# Patient Record
Sex: Female | Born: 1980 | Race: Black or African American | Hispanic: No | Marital: Single | Smoking: Never smoker
Health system: Southern US, Community
[De-identification: ages and names within clinical notes are randomized; demographics above are authoritative.]

## PROBLEM LIST (undated history)

## (undated) DIAGNOSIS — R51 Headache: Secondary | ICD-10-CM

## (undated) DIAGNOSIS — R519 Headache, unspecified: Secondary | ICD-10-CM

## (undated) DIAGNOSIS — F419 Anxiety disorder, unspecified: Secondary | ICD-10-CM

## (undated) HISTORY — PX: WISDOM TOOTH EXTRACTION: SHX21

---

## 2002-06-13 ENCOUNTER — Emergency Department (HOSPITAL_COMMUNITY): Admission: EM | Admit: 2002-06-13 | Discharge: 2002-06-13 | Payer: Self-pay | Admitting: Emergency Medicine

## 2002-08-30 ENCOUNTER — Emergency Department (HOSPITAL_COMMUNITY): Admission: EM | Admit: 2002-08-30 | Discharge: 2002-08-30 | Payer: Self-pay | Admitting: Emergency Medicine

## 2003-04-19 ENCOUNTER — Emergency Department (HOSPITAL_COMMUNITY): Admission: EM | Admit: 2003-04-19 | Discharge: 2003-04-19 | Payer: Self-pay | Admitting: Emergency Medicine

## 2004-03-18 ENCOUNTER — Emergency Department (HOSPITAL_COMMUNITY): Admission: EM | Admit: 2004-03-18 | Discharge: 2004-03-18 | Payer: Self-pay | Admitting: Emergency Medicine

## 2004-05-28 ENCOUNTER — Emergency Department (HOSPITAL_COMMUNITY): Admission: EM | Admit: 2004-05-28 | Discharge: 2004-05-28 | Payer: Self-pay | Admitting: Emergency Medicine
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
## 2010-04-01 ENCOUNTER — Emergency Department (HOSPITAL_COMMUNITY): Admission: EM | Admit: 2010-04-01 | Discharge: 2010-04-01 | Payer: Self-pay | Admitting: Emergency Medicine

## 2010-10-09 LAB — URINE MICROSCOPIC-ADD ON

## 2010-10-09 LAB — URINALYSIS, ROUTINE W REFLEX MICROSCOPIC: Urobilinogen, UA: 1 mg/dL (ref 0.0–1.0)

## 2010-10-09 LAB — WET PREP, GENITAL
Trich, Wet Prep: NONE SEEN
Yeast Wet Prep HPF POC: NONE SEEN

## 2010-10-09 LAB — POCT PREGNANCY, URINE: Preg Test, Ur: NEGATIVE

## 2014-08-09 ENCOUNTER — Ambulatory Visit (INDEPENDENT_AMBULATORY_CARE_PROVIDER_SITE_OTHER): Payer: 59 | Admitting: Podiatry

## 2014-08-09 ENCOUNTER — Ambulatory Visit (INDEPENDENT_AMBULATORY_CARE_PROVIDER_SITE_OTHER): Payer: 59

## 2014-08-09 ENCOUNTER — Encounter: Payer: Self-pay | Admitting: Podiatry

## 2014-08-09 VITALS — BP 117/67 | HR 78 | Resp 16

## 2014-08-09 DIAGNOSIS — R609 Edema, unspecified: Secondary | ICD-10-CM

## 2014-08-09 DIAGNOSIS — M779 Enthesopathy, unspecified: Secondary | ICD-10-CM

## 2014-08-09 MED ORDER — TRIAMCINOLONE ACETONIDE 10 MG/ML IJ SUSP
10.0000 mg | Freq: Once | INTRAMUSCULAR | Status: AC
  Administered 2014-08-09: 10 mg

## 2014-08-09 NOTE — Progress Notes (Signed)
Subjective:     Patient ID: Natasha [name], female   DOB: October 26, 1980, 34 y.o.   MRN: [id number]  HPI patient presents after not being seen for a number of years with pain in the medial side of the left foot. She is extremely obese which is complicating factor with flatfoot and had this problem on the right foot about 3 years ago   Review of Systems  All other systems reviewed and are negative.      Objective:   Physical Exam  Constitutional: She is oriented to person, place, and time.  Cardiovascular: Intact distal pulses.   Musculoskeletal: Normal range of motion.  Neurological: She is oriented to person, place, and time.  Skin: Skin is warm.  Nursing note and vitals reviewed.  neurovascular status intact muscle strength adequate with range of motion subtalar midtarsal joint within normal limits. Patient's noted to have good digital perfusion and she's well oriented 3 with severe flatfoot deformity bilateral with obesity as a complicating factor. There is quite a bit of discomfort around the posterior tibial insertion into the navicular with inflammation and fluid buildup noted but muscle strength appears normal     Assessment:     Posterior tibial tendinitis secondary to foot structure and obesity    Plan:     H&P and x-rays reviewed. We reviewed the acute and chronic nature of her conditions the fact she's now had an on both feet and today I did a careful sheath injection of the posterior tibial tendon at the insertion 3 mg Kenalog 5 mg Xylocaine and applied fascial brace with instructions on usage. Scanned for custom orthotics for the long-term and reappoint when those are ready in 3 weeks or earlier if issues should occur

## 2016-07-08 ENCOUNTER — Encounter: Payer: Self-pay | Admitting: Diagnostic Neuroimaging

## 2016-07-08 ENCOUNTER — Ambulatory Visit (INDEPENDENT_AMBULATORY_CARE_PROVIDER_SITE_OTHER): Payer: 59 | Admitting: Diagnostic Neuroimaging

## 2016-07-08 VITALS — BP 100/69 | HR 60 | Ht 63.75 in | Wt 387.0 lb

## 2016-07-08 DIAGNOSIS — Z6841 Body Mass Index (BMI) 40.0 and over, adult: Secondary | ICD-10-CM | POA: Diagnosis not present

## 2016-07-08 DIAGNOSIS — H4710 Unspecified papilledema: Secondary | ICD-10-CM

## 2016-07-08 NOTE — Progress Notes (Signed)
[facility]  PATIENT: Natasha [name] DOB: [birth year]  REFERRING CLINICIAN: Mart Piggs, OD HISTORY FROM: patient  REASON FOR VISIT: new consult    HISTORICAL  CHIEF COMPLAINT:  Chief Complaint  Patient presents with  . Optic nerve swelling    rm 7, New Pt    HISTORY OF PRESENT ILLNESS:    35 year old female here for evaluation of papilledema. Patient has been wearing glasses and contact lenses since age 4 years old. No recent change in vision, headaches, ringing in ears. She has intermittent headaches and when she laughs she sometimes has pressure in her head. Sometimes he has had episodes of dizziness while looking up. No transient visual obscuration. Patient has had significant weight gain over the past 10 years from 315 up to 384 pounds. 40 pounds weight gain was in the last 1 year.  Patient went for routine eye examination was found to have mild papilledema. Therefore patient referred to me for further evaluation consideration of possible pseudotumor cerebri.   REVIEW OF SYSTEMS: Full 14 system review of systems performed and negative with exception of: Weight gain feeling hot.  ALLERGIES: No Known Allergies  HOME MEDICATIONS: Outpatient Medications Prior to Visit  Medication Sig Dispense Refill  . PRESCRIPTION MEDICATION BCP     No facility-administered medications prior to visit.     PAST MEDICAL HISTORY: History reviewed. No pertinent past medical history.  PAST SURGICAL HISTORY: Past Surgical History:  Procedure Laterality Date  . WISDOM TOOTH EXTRACTION      FAMILY HISTORY: History reviewed. No pertinent family history.  SOCIAL HISTORY:  Social History   Social History  . Marital status: Single    Spouse name: N/A  . Number of children: 0  . Years of education: 60   Occupational History  . [employer]    Social History Main Topics  . Smoking status: Never Smoker  . Smokeless tobacco: Never Used  . Alcohol use 0.0 oz/week   Comment: 1 every 2 weeks  . Drug use: No  . Sexual activity: Not on file   Other Topics Concern  . Not on file   Social History Narrative   Lives with sig other   No caffeine     PHYSICAL EXAM  GENERAL EXAM/CONSTITUTIONAL: Vitals:  Vitals:   07/08/16 0846  BP: 100/69  Pulse: 60  Weight: (!) 387 lb (175.5 kg)  Height: 5' 3.75" (1.619 m)     Body mass index is 66.95 kg/m.  Visual Acuity Screening   Right eye Left eye Both eyes  Without correction:     With correction: 20/50 20/50   Comments: 07/08/16 contacts    Patient is in no distress; well developed, nourished and groomed; neck is supple  CARDIOVASCULAR:  Examination of carotid arteries is normal; no carotid bruits  Regular rate and rhythm, no murmurs  Examination of peripheral vascular system by observation and palpation is normal  EYES:  Ophthalmoscopic exam of optic discs and posterior segments is normal; no papilledema or hemorrhages  MUSCULOSKELETAL:  Gait, strength, tone, movements noted in Neurologic exam below  NEUROLOGIC: MENTAL STATUS:  No flowsheet data found.  awake, alert, oriented to person, place and time  recent and remote memory intact  normal attention and concentration  language fluent, comprehension intact, naming intact,   fund of knowledge appropriate  CRANIAL NERVE:   2nd - MILD BLURRED DISC MARGINS INFERIORLY  2nd, 3rd, 4th, 6th - pupils equal and reactive to light, visual fields full to confrontation, extraocular  muscles intact, no nystagmus  5th - facial sensation symmetric  7th - facial strength symmetric  8th - hearing intact  9th - palate elevates symmetrically, uvula midline  11th - shoulder shrug symmetric  12th - tongue protrusion midline  MOTOR:   normal bulk and tone, full strength in the BUE, BLE  SENSORY:   normal and symmetric to light touch, pinprick, temperature, vibration  COORDINATION:   finger-nose-finger, fine finger movements  normal  REFLEXES:   deep tendon reflexes present and symmetric  GAIT/STATION:   narrow based gait; able to walk on toes, heels and tandem; romberg is negative    DIAGNOSTIC DATA (LABS, IMAGING, TESTING) - I reviewed patient records, labs, notes, testing and imaging myself where available.  No results found for: WBC, HGB, HCT, MCV, PLT No results found for: NA, K, CL, CO2, GLUCOSE, BUN, CREATININE, CALCIUM, PROT, ALBUMIN, AST, ALT, ALKPHOS, BILITOT, GFRNONAA, GFRAA No results found for: CHOL, HDL, LDLCALC, LDLDIRECT, TRIG, CHOLHDL No results found for: HGBA1C No results found for: VITAMINB12 No results found for: TSH      ASSESSMENT AND PLAN  35 y.o. year old female here with 70 pound weight gain over the past 10 years, with 40 pounds weight gain the past one year, now with new onset of papilledema. No significant headaches, tinnitus, blurred vision problems. Patient has significant obesity with BMI of 67. Patient is at high risk for idiopathic intracranial hypertension.   Ddx: IIH (idiopathic intracranial hypertension) vs secondary causes (mass, stroke, inflammation)  1. Papilledema   2. BMI 60.0-69.9, adult (HCC)      PLAN: - check MRI brain w/wo (rule out mass, stroke or other cause of papilledema) - check sleep study (rule out OSA due to severe obesity and papilledema) - then consider lumbar puncture to measure pressure - consider empiric topiramate in future - reviewed nutrition, exercise, occupational hazards (sitting for prolonged time) and weight loss strategies; may need to consider referral to medical weight mgmt clinic or bariatric surgery options for BMI 67  Orders Placed This Encounter  Procedures  . MR BRAIN W WO CONTRAST  . Ambulatory referral to Sleep Studies   Return in about 2 months (around 09/08/2016).  I reviewed labs, notes, records myself. I summarized findings and reviewed with patient, for this high risk condition (papilledema; severe obesity  BMI 67) requiring high complexity decision making.     Penni Bombard, MD [id number], [id number] AM Certified in Neurology, Neurophysiology and Neuroimaging  [facility] [address] [place], [place] [address] [phone]

## 2016-07-08 NOTE — Patient Instructions (Signed)
Thank you for coming to see [place] at [facility]. I hope we have been able to provide you high quality care today.  You may receive a patient satisfaction survey over the next few weeks. We would appreciate your feedback and comments so that we may continue to improve ourselves and the health of our patients.  - I will check MRI brain and sleep study  - You may read about IIH (idiopathic intracranial hypertension) online, which is a possible diagnosis    ~~~~~~~~~~~~~~~~~~~~~~~~~~~~~~~~~~~~~~~~~~~~~~~~~~~~~~~~~~~~~~~~~  DR. PENUMALLI'S GUIDE TO HAPPY AND HEALTHY LIVING These are some of my general health and wellness recommendations. Some of them may apply to you better than others. Please use common sense as you try these suggestions and feel free to ask me any questions.   ACTIVITY/FITNESS Mental, social, emotional and physical stimulation are very important for brain and body health. Try learning a new activity (arts, music, language, sports, games).  Keep moving your body to the best of your abilities. You can do this at home, inside or outside, the park, community center, gym or anywhere you like. Consider a physical therapist or personal trainer to get started. Consider the app Sworkit. Fitness trackers such as smart-watches, smart-phones or Fitbits can help as well.   NUTRITION Eat more plants: colorful vegetables, nuts, seeds and berries.  Eat less sugar, salt, preservatives and processed foods.  Avoid toxins such as cigarettes and alcohol.  Drink water when you are thirsty. Warm water with a slice of lemon is an excellent morning drink to start the day.  Consider these websites for more information The Nutrition Source (https://www.henry-hernandez.biz/) Precision Nutrition (WindowBlog.ch)   RELAXATION Consider practicing mindfulness meditation or other relaxation techniques such as deep breathing, prayer, yoga,  tai chi, massage. See website mindful.org or the apps Headspace or Calm to help get started.   SLEEP Try to get at least 7-8+ hours sleep per day. Regular exercise and reduced caffeine will help you sleep better. Practice good sleep hygeine techniques. See website sleep.org for more information.   PLANNING Prepare estate planning, living will, healthcare POA documents. Sometimes this is best planned with the help of an attorney. Theconversationproject.org and agingwithdignity.org are excellent resources.

## 2016-10-05 ENCOUNTER — Other Ambulatory Visit: Payer: Self-pay | Admitting: Obstetrics and Gynecology

## 2016-10-16 NOTE — Patient Instructions (Signed)
Your procedure is scheduled on:  Tuesday, October 20, 2016  Enter through the [employer] [facility] at:  12:30 PM  Pick up the phone at the desk and dial [phone].  Call this number if you have problems the morning of surgery: [phone].  Remember: Do NOT eat food:  After 6:00 AM day of surgery  Do NOT drink clear liquids after:  10:00 AM day of surgery  Take these medicines the morning of surgery with a SIP OF WATER:  None  Stop ALL herbal medications at this time  Do NOT smoke the day of surgery.  Do NOT wear jewelry (body piercing), metal hair clips/bobby pins, make-up, or nail polish. Do NOT wear lotions, powders, or perfumes.  You may wear deodorant. Do NOT shave for 48 hours prior to surgery. Do NOT bring valuables to the hospital. Contacts, dentures, or bridgework may not be worn into surgery.  Have a responsible adult drive you home and stay with you for 24 hours after your procedure  Bring a copy of your healthcare power of attorney and living will documents.

## 2016-10-19 ENCOUNTER — Encounter (HOSPITAL_COMMUNITY): Payer: Self-pay

## 2016-10-19 ENCOUNTER — Encounter (HOSPITAL_COMMUNITY)
Admission: RE | Admit: 2016-10-19 | Discharge: 2016-10-19 | Disposition: A | Discharge status: Home / Self Care | Payer: BLUE CROSS/BLUE SHIELD | Source: Ambulatory Visit | Attending: Obstetrics and Gynecology | Admitting: Obstetrics and Gynecology

## 2016-10-19 DIAGNOSIS — I10 Essential (primary) hypertension: Secondary | ICD-10-CM | POA: Diagnosis not present

## 2016-10-19 DIAGNOSIS — N83202 Unspecified ovarian cyst, left side: Secondary | ICD-10-CM | POA: Diagnosis not present

## 2016-10-19 DIAGNOSIS — Z6841 Body Mass Index (BMI) 40.0 and over, adult: Secondary | ICD-10-CM | POA: Diagnosis not present

## 2016-10-19 DIAGNOSIS — D259 Leiomyoma of uterus, unspecified: Secondary | ICD-10-CM | POA: Diagnosis not present

## 2016-10-19 DIAGNOSIS — N838 Other noninflammatory disorders of ovary, fallopian tube and broad ligament: Secondary | ICD-10-CM | POA: Diagnosis not present

## 2016-10-19 HISTORY — DX: Headache: R51

## 2016-10-19 HISTORY — DX: Headache, unspecified: R51.9

## 2016-10-19 HISTORY — DX: Anxiety disorder, unspecified: F41.9

## 2016-10-19 HISTORY — DX: Morbid (severe) obesity due to excess calories: E66.01

## 2016-10-19 LAB — CBC
HCT: 39.7 % (ref 36.0–46.0)
Hemoglobin: 13.2 g/dL (ref 12.0–15.0)
MCH: 28.8 pg (ref 26.0–34.0)
MCHC: 33.2 g/dL (ref 30.0–36.0)
MCV: 86.5 fL (ref 78.0–100.0)
Platelets: 328 10*3/uL (ref 150–400)
RBC: 4.59 MIL/uL (ref 3.87–5.11)
RDW: 13.9 % (ref 11.5–15.5)
WBC: 9.7 10*3/uL (ref 4.0–10.5)

## 2016-10-19 NOTE — H&P (Signed)
Natasha [name] is a 36 y.o. female G:0, : who presents for removal of a persistent left ovarian cyst.  Patient  was on oral contraceptives until January 2017 when she stopped.  After that time she was amenorrheic from February until  August 2017.  In August she began to bleed daily until October 2017 with a need to change  her   pad 1-4 times a day.  She denies any cramping, pelvic pain, dyspareunia or change in bladder function.  She does report some slowing of her  bowel movements. Her TSH, Prolactin, fasting glucose, insulin and FSH were normal with a low progesterone consistent with anovulation.  A pelvic ultrasound in December 2017 revealed: anteverted uterus: 6.81 x 4.45 x 4.00 cm, endometrium: 16.7 mm; posterior left mid-subserosal fibroid 4.86 cm; right ovary: 2.26 cm and left ovary: 4.85 cm with simple appearing cyst: 4.5 cm.  She was placed on Progesterone 200 mg to thin out her endometrium.  A CA-125 and CEA were performed and both returned normal.  In March 2018 a follow up ultrasound showed the following changes:  endometrium: 0.78 cm (from 1.67 cm) and left ovarian cyst: 6.81 cm with enlarging cyst now 6.25 cm (from 4.5 cm).  Given the persistent and enlarging nature of her left ovarian cyst, along with her history of anovulation the patient has decided to proceed with removal of the left ovarian cyst.   Past Medical History  OB History: G: 0  GYN History: menarche: 36 YO  LMP: 09/17/16    Contracepton no method  The patient denies history of sexually transmitted disease.  Denies history of abnormal PAP smear.  Last PAP smear: 06/2016-normal with negative HPV  Medical History: Vitamin D Deficiency,  Eczema and  Seasonal Allergies  Surgical History: Dental Surgeries Denies history of blood transfusions  Family History: Asthma, Diabetes Mellitus, Stroke, Arthritis, Hypertension and Migraine  Social History:  Single and employed with a [employer];   Denies tobacco use but occasionally  uses alcohol   Medications: Triamcinolone Cream as directed Micronized Progesterone 200 mg qhs as directed monthly   Allergies  Allergen Reactions  . Nitrofurantoin     Did not work to treat UTI symptoms    Denies sensitivity to peanuts, shellfish, soy, latex or adhesives.   ROS: Admits to glasses and constipation but  denies headache, vision changes, nasal congestion, dysphagia, tinnitus, dizziness, hoarseness, cough,  chest pain, shortness of breath, nausea, vomiting, diarrhea,  urinary frequency, urgency  dysuria, hematuria, vaginitis symptoms, pelvic pain, swelling of joints,easy bruising,  myalgias, arthralgias,  unexplained weight loss and except as is mentioned in the history of present illness, patient's review of systems is otherwise negative.   Physical Exam  Bp: 118/76   Weight: 380 lbs.  Height: 5' 3.75"  BMI: 65.7  Neck: supple without masses or thyromegaly Lungs: clear to auscultation Heart: regular rate and rhythm Abdomen: soft, non-tender and no organomegaly Pelvic:EGBUS- wnl; vagina-normal rugae; uterus-normal size (exam limited by habitus), cervix without lesions or motion tenderness; adnexae-no tenderness or masses Extremities:  no clubbing, cyanosis or edema   Assesment: Persistent Left Ovarian Cyst  Disposition:  The patient was given the indication for her procedure(s) along with the risks, which include but are not limited to, reaction to anesthesia, damage to adjacent organs, infection, excessive bleeding, formation of scar tissue, removal of ovarian tissue  and the possible need for an open abdominal incision. She was further advised that she may experience transient post operative facial edema,  that her hospital stay is expected to be 0-1 days  and that the robotic approach to her surgery requires more time to perform than an open abdominal approach.  She verbalized understanding of these risks and has consented to proceed with Laparoscopic Left Ovarian  Cystectomy with Possible Robot Assistance and Endometrial Biopsy  at [place] on October 20, 2016.    CSN# [id number]   Yerania Chamorro J. Florene Glen, PA-C  for Dr. Dede Query. Rivard

## 2016-10-20 ENCOUNTER — Ambulatory Visit (HOSPITAL_COMMUNITY)
Admission: RE | Admit: 2016-10-20 | Discharge: 2016-10-20 | Disposition: A | Discharge status: Home / Self Care | Payer: BLUE CROSS/BLUE SHIELD | Source: Ambulatory Visit | Attending: Obstetrics and Gynecology | Admitting: Obstetrics and Gynecology

## 2016-10-20 ENCOUNTER — Encounter (HOSPITAL_COMMUNITY)
Admission: RE | Disposition: A | Discharge status: Home / Self Care | Payer: Self-pay | Source: Ambulatory Visit | Attending: Obstetrics and Gynecology

## 2016-10-20 ENCOUNTER — Ambulatory Visit (HOSPITAL_COMMUNITY): Payer: BLUE CROSS/BLUE SHIELD | Admitting: Registered Nurse

## 2016-10-20 ENCOUNTER — Encounter (HOSPITAL_COMMUNITY): Payer: Self-pay | Admitting: *Deleted

## 2016-10-20 DIAGNOSIS — N838 Other noninflammatory disorders of ovary, fallopian tube and broad ligament: Secondary | ICD-10-CM | POA: Insufficient documentation

## 2016-10-20 DIAGNOSIS — N83202 Unspecified ovarian cyst, left side: Secondary | ICD-10-CM | POA: Insufficient documentation

## 2016-10-20 DIAGNOSIS — Z6841 Body Mass Index (BMI) 40.0 and over, adult: Secondary | ICD-10-CM | POA: Insufficient documentation

## 2016-10-20 DIAGNOSIS — I10 Essential (primary) hypertension: Secondary | ICD-10-CM | POA: Insufficient documentation

## 2016-10-20 DIAGNOSIS — D259 Leiomyoma of uterus, unspecified: Secondary | ICD-10-CM | POA: Insufficient documentation

## 2016-10-20 HISTORY — PX: LAPAROSCOPY: SHX197

## 2016-10-20 LAB — PREGNANCY, URINE: Preg Test, Ur: NEGATIVE

## 2016-10-20 SURGERY — LAPAROSCOPY OPERATIVE
Anesthesia: General | Laterality: Left

## 2016-10-20 MED ORDER — ROPIVACAINE HCL 5 MG/ML IJ SOLN
INTRAMUSCULAR | Status: AC
  Filled 2016-10-20: qty 60

## 2016-10-20 MED ORDER — SCOPOLAMINE 1 MG/3DAYS TD PT72
1.0000 | MEDICATED_PATCH | Freq: Once | TRANSDERMAL | Status: DC
  Administered 2016-10-20: 1.5 mg via TRANSDERMAL

## 2016-10-20 MED ORDER — SCOPOLAMINE 1 MG/3DAYS TD PT72
MEDICATED_PATCH | TRANSDERMAL | Status: AC
  Administered 2016-10-20: 1.5 mg via TRANSDERMAL
  Filled 2016-10-20: qty 1

## 2016-10-20 MED ORDER — OXYCODONE-ACETAMINOPHEN 5-325 MG PO TABS: 1.0000 | ORAL_TABLET | ORAL | 0 refills | Status: AC | PRN

## 2016-10-20 MED ORDER — CEFOTETAN DISODIUM-DEXTROSE 2-2.08 GM-% IV SOLR
INTRAVENOUS | Status: AC
  Filled 2016-10-20: qty 50

## 2016-10-20 MED ORDER — FENTANYL CITRATE (PF) 100 MCG/2ML IJ SOLN
INTRAMUSCULAR | Status: DC | PRN
  Administered 2016-10-20 (×3): 50 ug via INTRAVENOUS
  Administered 2016-10-20: 100 ug via INTRAVENOUS

## 2016-10-20 MED ORDER — PROPOFOL 10 MG/ML IV BOLUS
INTRAVENOUS | Status: AC
  Filled 2016-10-20: qty 20

## 2016-10-20 MED ORDER — KETOROLAC TROMETHAMINE 30 MG/ML IJ SOLN
INTRAMUSCULAR | Status: AC
  Filled 2016-10-20: qty 1

## 2016-10-20 MED ORDER — STERILE WATER FOR IRRIGATION IR SOLN: Status: DC | PRN

## 2016-10-20 MED ORDER — SUCCINYLCHOLINE CHLORIDE 200 MG/10ML IV SOSY
PREFILLED_SYRINGE | INTRAVENOUS | Status: AC
  Filled 2016-10-20: qty 10

## 2016-10-20 MED ORDER — MIDAZOLAM HCL 2 MG/2ML IJ SOLN
INTRAMUSCULAR | Status: AC
  Filled 2016-10-20: qty 2

## 2016-10-20 MED ORDER — ROCURONIUM BROMIDE 100 MG/10ML IV SOLN
INTRAVENOUS | Status: DC | PRN
  Administered 2016-10-20: 50 mg via INTRAVENOUS

## 2016-10-20 MED ORDER — ARTIFICIAL TEARS OP OINT
TOPICAL_OINTMENT | OPHTHALMIC | Status: AC
  Filled 2016-10-20: qty 3.5

## 2016-10-20 MED ORDER — SUGAMMADEX SODIUM 500 MG/5ML IV SOLN
INTRAVENOUS | Status: DC | PRN
Start: 2016-10-20 — End: 2016-10-20
  Administered 2016-10-20: 350 mg via INTRAVENOUS

## 2016-10-20 MED ORDER — ROCURONIUM BROMIDE 100 MG/10ML IV SOLN
INTRAVENOUS | Status: AC
  Filled 2016-10-20: qty 1

## 2016-10-20 MED ORDER — FENTANYL CITRATE (PF) 100 MCG/2ML IJ SOLN
25.0000 ug | INTRAMUSCULAR | Status: DC | PRN
  Administered 2016-10-20: 50 ug via INTRAVENOUS

## 2016-10-20 MED ORDER — ACETAMINOPHEN 160 MG/5ML PO SOLN: 325.0000 mg | ORAL | Status: DC | PRN

## 2016-10-20 MED ORDER — VASOPRESSIN 20 UNIT/ML IV SOLN
INTRAVENOUS | Status: AC
  Filled 2016-10-20: qty 1

## 2016-10-20 MED ORDER — SUGAMMADEX SODIUM 500 MG/5ML IV SOLN
INTRAVENOUS | Status: AC
  Filled 2016-10-20: qty 5

## 2016-10-20 MED ORDER — IBUPROFEN 600 MG PO TABS: 600.0000 mg | ORAL_TABLET | Freq: Four times a day (QID) | ORAL | 0 refills | Status: AC | PRN

## 2016-10-20 MED ORDER — OXYCODONE HCL 5 MG PO TABS: 5.0000 mg | ORAL_TABLET | Freq: Once | ORAL | Status: DC | PRN

## 2016-10-20 MED ORDER — SODIUM CHLORIDE 0.9 % IJ SOLN
INTRAMUSCULAR | Status: AC
  Filled 2016-10-20: qty 50

## 2016-10-20 MED ORDER — LIDOCAINE HCL (CARDIAC) 20 MG/ML IV SOLN
INTRAVENOUS | Status: AC
  Filled 2016-10-20: qty 5

## 2016-10-20 MED ORDER — LACTATED RINGERS IR SOLN
Status: DC | PRN
  Administered 2016-10-20: 3000 mL

## 2016-10-20 MED ORDER — SODIUM CHLORIDE 0.9 % IV SOLN
INTRAVENOUS | Status: DC | PRN
  Administered 2016-10-20: 27 mL

## 2016-10-20 MED ORDER — VASOPRESSIN 20 UNIT/ML IV SOLN
INTRAVENOUS | Status: DC | PRN
  Administered 2016-10-20: 17 mL via INTRAMUSCULAR

## 2016-10-20 MED ORDER — SODIUM CHLORIDE 0.9 % IJ SOLN
INTRAMUSCULAR | Status: AC
  Filled 2016-10-20: qty 10

## 2016-10-20 MED ORDER — KETOROLAC TROMETHAMINE 30 MG/ML IJ SOLN
INTRAMUSCULAR | Status: DC | PRN
  Administered 2016-10-20: 30 mg via INTRAVENOUS

## 2016-10-20 MED ORDER — SUCCINYLCHOLINE CHLORIDE 20 MG/ML IJ SOLN
INTRAMUSCULAR | Status: DC | PRN
  Administered 2016-10-20: 140 mg via INTRAVENOUS

## 2016-10-20 MED ORDER — MIDAZOLAM HCL 2 MG/2ML IJ SOLN
INTRAMUSCULAR | Status: DC | PRN
  Administered 2016-10-20: 2 mg via INTRAVENOUS

## 2016-10-20 MED ORDER — MEPERIDINE HCL 25 MG/ML IJ SOLN: 6.2500 mg | INTRAMUSCULAR | Status: DC | PRN

## 2016-10-20 MED ORDER — FENTANYL CITRATE (PF) 250 MCG/5ML IJ SOLN
INTRAMUSCULAR | Status: AC
  Filled 2016-10-20: qty 5

## 2016-10-20 MED ORDER — ONDANSETRON HCL 4 MG/2ML IJ SOLN
INTRAMUSCULAR | Status: AC
  Filled 2016-10-20: qty 2

## 2016-10-20 MED ORDER — ACETAMINOPHEN 325 MG PO TABS: 325.0000 mg | ORAL_TABLET | ORAL | Status: DC | PRN

## 2016-10-20 MED ORDER — SODIUM CHLORIDE 0.9 % IJ SOLN
INTRAMUSCULAR | Status: AC
  Filled 2016-10-20: qty 100

## 2016-10-20 MED ORDER — LIDOCAINE HCL (CARDIAC) 20 MG/ML IV SOLN
INTRAVENOUS | Status: DC | PRN
  Administered 2016-10-20: 80 mg via INTRAVENOUS

## 2016-10-20 MED ORDER — ONDANSETRON HCL 4 MG/2ML IJ SOLN: 4.0000 mg | Freq: Once | INTRAMUSCULAR | Status: DC | PRN

## 2016-10-20 MED ORDER — OXYCODONE HCL 5 MG/5ML PO SOLN: 5.0000 mg | Freq: Once | ORAL | Status: DC | PRN

## 2016-10-20 MED ORDER — KETOROLAC TROMETHAMINE 30 MG/ML IJ SOLN: 30.0000 mg | Freq: Once | INTRAMUSCULAR | Status: DC

## 2016-10-20 MED ORDER — FENTANYL CITRATE (PF) 100 MCG/2ML IJ SOLN
INTRAMUSCULAR | Status: AC
  Administered 2016-10-20: 50 ug via INTRAVENOUS
  Filled 2016-10-20: qty 2

## 2016-10-20 MED ORDER — CEFOTETAN DISODIUM-DEXTROSE 2-2.08 GM-% IV SOLR
2.0000 g | INTRAVENOUS | Status: AC
  Administered 2016-10-20: 2 g via INTRAVENOUS

## 2016-10-20 MED ORDER — PROPOFOL 10 MG/ML IV BOLUS
INTRAVENOUS | Status: DC | PRN
  Administered 2016-10-20: 200 mg via INTRAVENOUS

## 2016-10-20 MED ORDER — ARTIFICIAL TEARS OP OINT
TOPICAL_OINTMENT | OPHTHALMIC | Status: DC | PRN
  Administered 2016-10-20: 1 via OPHTHALMIC

## 2016-10-20 MED ORDER — LACTATED RINGERS IV SOLN
INTRAVENOUS | Status: DC
  Administered 2016-10-20 (×2): via INTRAVENOUS

## 2016-10-20 MED ORDER — DEXAMETHASONE SODIUM PHOSPHATE 10 MG/ML IJ SOLN
INTRAMUSCULAR | Status: DC | PRN
  Administered 2016-10-20: 4 mg via INTRAVENOUS

## 2016-10-20 MED ORDER — DEXAMETHASONE SODIUM PHOSPHATE 4 MG/ML IJ SOLN
INTRAMUSCULAR | Status: AC
  Filled 2016-10-20: qty 1

## 2016-10-20 MED ORDER — ONDANSETRON HCL 4 MG/2ML IJ SOLN
INTRAMUSCULAR | Status: DC | PRN
  Administered 2016-10-20: 4 mg via INTRAVENOUS

## 2016-10-20 SURGICAL SUPPLY — 57 items
BARRIER ADHS 3X4 INTERCEED (GAUZE/BANDAGES/DRESSINGS) ×3 IMPLANT
CATH FOLEY 2WAY SLVR  5CC 16FR (CATHETERS) ×2
CATH FOLEY 2WAY SLVR 5CC 16FR (CATHETERS) ×1 IMPLANT
CATH FOLEY 3WAY  5CC 16FR (CATHETERS)
CATH FOLEY 3WAY 5CC 16FR (CATHETERS) IMPLANT
CLOTH BEACON ORANGE TIMEOUT ST (SAFETY) ×3 IMPLANT
CONT PATH 16OZ SNAP LID 3702 (MISCELLANEOUS) ×3 IMPLANT
COVER BACK TABLE 60X90IN (DRAPES) ×6 IMPLANT
COVER TIP SHEARS 8 DVNC (MISCELLANEOUS) ×1 IMPLANT
COVER TIP SHEARS 8MM DA VINCI (MISCELLANEOUS) ×2
DECANTER SPIKE VIAL GLASS SM (MISCELLANEOUS) ×6 IMPLANT
DEFOGGER SCOPE WARMER CLEARIFY (MISCELLANEOUS) ×3 IMPLANT
DERMABOND ADVANCED (GAUZE/BANDAGES/DRESSINGS) ×2
DERMABOND ADVANCED .7 DNX12 (GAUZE/BANDAGES/DRESSINGS) ×1 IMPLANT
DRSG OPSITE POSTOP 3X4 (GAUZE/BANDAGES/DRESSINGS) ×3 IMPLANT
DURAPREP 26ML APPLICATOR (WOUND CARE) ×3 IMPLANT
ELECT REM PT RETURN 9FT ADLT (ELECTROSURGICAL) ×3
ELECTRODE REM PT RTRN 9FT ADLT (ELECTROSURGICAL) ×1 IMPLANT
GAUZE VASELINE 3X9 (GAUZE/BANDAGES/DRESSINGS) IMPLANT
GLOVE BIOGEL PI IND STRL 7.0 (GLOVE) ×4 IMPLANT
GLOVE BIOGEL PI INDICATOR 7.0 (GLOVE) ×8
GLOVE ECLIPSE 6.5 STRL STRAW (GLOVE) ×12 IMPLANT
KIT ACCESSORY DA VINCI DISP (KITS) ×2
KIT ACCESSORY DVNC DISP (KITS) ×1 IMPLANT
NEEDLE SPNL 22GX7 QUINCKE BK (NEEDLE) ×3 IMPLANT
PACK ROBOT WH (CUSTOM PROCEDURE TRAY) ×3 IMPLANT
PACK ROBOTIC GOWN (GOWN DISPOSABLE) ×3 IMPLANT
PACK TRENDGUARD 450 HYBRID PRO (MISCELLANEOUS) IMPLANT
PACK TRENDGUARD 600 HYBRD PROC (MISCELLANEOUS) ×1 IMPLANT
PAD PREP 24X48 CUFFED NSTRL (MISCELLANEOUS) ×6 IMPLANT
PROTECTOR NERVE ULNAR (MISCELLANEOUS) ×6 IMPLANT
SET CYSTO W/LG BORE CLAMP LF (SET/KITS/TRAYS/PACK) IMPLANT
SET IRRIG TUBING LAPAROSCOPIC (IRRIGATION / IRRIGATOR) ×3 IMPLANT
SET TRI-LUMEN FLTR TB AIRSEAL (TUBING) IMPLANT
SOLUTION ANTI FOG 6CC (MISCELLANEOUS) ×3 IMPLANT
SUT MNCRL AB 3-0 PS2 27 (SUTURE) ×6 IMPLANT
SUT VIC AB 0 CT2 27 (SUTURE) IMPLANT
SUT VIC AB 2-0 CT2 27 (SUTURE) IMPLANT
SUT VICRYL 0 UR6 27IN ABS (SUTURE) ×3 IMPLANT
SUT VLOC 180 0 9IN  GS21 (SUTURE)
SUT VLOC 180 0 9IN GS21 (SUTURE) IMPLANT
SYR 50ML LL SCALE MARK (SYRINGE) ×3 IMPLANT
SYR CONTROL 10ML LL (SYRINGE) ×6 IMPLANT
SYSTEM CONVERTIBLE TROCAR (TROCAR) ×3 IMPLANT
TIP UTERINE 5.1X6CM LAV DISP (MISCELLANEOUS) IMPLANT
TIP UTERINE 6.7X10CM GRN DISP (MISCELLANEOUS) IMPLANT
TIP UTERINE 6.7X6CM WHT DISP (MISCELLANEOUS) IMPLANT
TIP UTERINE 6.7X8CM BLUE DISP (MISCELLANEOUS) ×3 IMPLANT
TOWEL OR 17X24 6PK STRL BLUE (TOWEL DISPOSABLE) ×9 IMPLANT
TRENDGUARD 450 HYBRID PRO PACK (MISCELLANEOUS)
TRENDGUARD 600 HYBRID PROC PK (MISCELLANEOUS) ×3
TROCAR 12M 150ML BLUNT (TROCAR) ×3 IMPLANT
TROCAR 5M 150ML BLDLS (TROCAR) ×6 IMPLANT
TROCAR DISP BLADELESS 8 DVNC (TROCAR) ×1 IMPLANT
TROCAR DISP BLADELESS 8MM (TROCAR) ×2
TROCAR PORT AIRSEAL 8X120 (TROCAR) ×3 IMPLANT
WATER STERILE IRR 1000ML POUR (IV SOLUTION) ×3 IMPLANT

## 2016-10-20 NOTE — Anesthesia Procedure Notes (Signed)
Procedure Name: Intubation Date/Time: 10/20/2016 2:05 PM Performed by: Raenette Rover Pre-anesthesia Checklist: Patient identified, Emergency Drugs available, Suction available and Patient being monitored Patient Re-evaluated:Patient Re-evaluated prior to inductionOxygen Delivery Method: Circle system utilized Preoxygenation: Pre-oxygenation with 100% oxygen Intubation Type: IV induction Ventilation: Two handed mask ventilation required, Oral airway inserted - appropriate to patient size and Mask ventilation without difficulty Laryngoscope Size: Mac and 3 Grade View: Grade I Tube type: Oral Tube size: 7.0 mm Number of attempts: 1 Airway Equipment and Method: Patient positioned with wedge pillow and Stylet Placement Confirmation: ETT inserted through vocal cords under direct vision,  breath sounds checked- equal and bilateral,  positive ETCO2 and CO2 detector Secured at: 21 cm Tube secured with: Tape Dental Injury: Teeth and Oropharynx as per pre-operative assessment

## 2016-10-20 NOTE — Anesthesia Preprocedure Evaluation (Signed)
Anesthesia Evaluation  Patient identified by MRN, date of birth, ID band Patient awake    Reviewed: Allergy & Precautions, NPO status , Patient's Chart, lab work & pertinent test results  Airway Mallampati: I  TM Distance: >3 FB Neck ROM: Full    Dental no notable dental hx.    Pulmonary neg pulmonary ROS,    Pulmonary exam normal        Cardiovascular hypertension, Normal cardiovascular exam Rhythm:Regular Rate:Normal     Neuro/Psych    GI/Hepatic negative GI ROS, Neg liver ROS,   Endo/Other  Morbid obesity  Renal/GU negative Renal ROS  negative genitourinary   Musculoskeletal negative musculoskeletal ROS (+)   Abdominal (+) + obese,   Peds  Hematology negative hematology ROS (+)   Anesthesia Other Findings   Reproductive/Obstetrics negative OB ROS                             Anesthesia Physical Anesthesia Plan  ASA: III  Anesthesia Plan: General   Post-op Pain Management:    Induction: Intravenous  Airway Management Planned: Oral ETT  Additional Equipment:   Intra-op Plan:   Post-operative Plan: Extubation in OR  Informed Consent: I have reviewed the patients History and Physical, chart, labs and discussed the procedure including the risks, benefits and alternatives for the proposed anesthesia with the patient or authorized representative who has indicated his/her understanding and acceptance.   Dental advisory given  Plan Discussed with: CRNA and Surgeon  Anesthesia Plan Comments:         Anesthesia Quick Evaluation

## 2016-10-20 NOTE — Interval H&P Note (Signed)
History and Physical Interval Note:  10/20/2016 12:51 PM  Natasha [name] [name]  has presented today for surgery, with the diagnosis of Left Ovariany Cyst - 2.5 hours  The various methods of treatment have been discussed with the patient and family. After consideration of risks, benefits and other options for treatment, the patient has consented to  Procedure(s) with comments: ROBOTIC ASSISTED LAPAROSCOPIC OVARIAN CYSTECTOMY (Left) - DO NOT DRAPEand endometrial biopsy as a surgical intervention .  The patient's history has been reviewed, patient examined, no change in status, stable for surgery.  I have reviewed the patient's chart and labs.  Questions were answered to the patient's satisfaction.     Tehila Sokolow A

## 2016-10-20 NOTE — Op Note (Signed)
Preoperative diagnosis: left ovarian cyst,DUB and morbid obesity ( BMI 68)  Postoperative diagnosis: left paratubal cyst, uterine fibroid and morbid obesity  Anesthesia: General   Anesthesiologist: Dr. Sabra Heck  Procedure: laparoscopic left paratubal cystectomy and endometrial biopsy  Surgeon: Dr. Katharine Look Iretta Mangrum   Assistant: Earnstine Regal P.A.-C .  Estimated blood loss: 10cc cc   Procedure:   After being informed of the planned procedure with possible complications including but not limited to bleeding, infection, injury to other organs, need for laparotomy, expected hospital stay and recovery, informed consent is obtained and patient is taken to or #7. She is placed in lithotomy position on a Pink pad with both arms padded and tucked on each side and bilateral knee-high sequential compressive devices. She is given general anesthesia with endotracheal intubation without any complication. She is prepped and draped in a sterile fashion. A three-way Foley catheter is inserted in her bladder.   Pelvic exam reveals: anteverted uterus, mobile but exam extremely limited by body habitus  A weighted speculum is inserted in the vagina and the anterior lip of the cervix is grasped with a tenaculum forcep. The uterus was then sounded at 8 cm. We proceed with endometrial biopsy using a Novak curette.We easily dilate the cervix using Hegar dilator to #27 which allows for easy placement of the intrauterine RUMI manipulator.  Trocar placement is decided. We infiltrate  the umbilicus with 10 cc of ropivacaine per protocol and perform a 10 mm semi-elliptical incision which is brought down bluntly to the fascia. The fascia is identified and grasped with Coker forceps. The fascia is incised with Mayo scissors. Peritoneum is entered bluntly. A pursestring suture of 0 Vicryl is placed on the fascia and a 10 mm Hassan trocar is easily inserted in the abdominal cavity held in placed with a Purstring suture. This allows  for easy insufflation of a pneumoperitoneum using warmed CO2 at a maximum pressure of 15 mm of mercury.We then placed a 5 mm trocar on the left and a 5 mm trocar on the righ after infiltrating every site with ropivacaine per protocol.   Observation: Uterus has a 6 cm fundal posterior fibroid.. Both ovaries are normal.Both tubes are normal except for a 6.5 cm left paratubal cyst distending the mesosalpynx and over which the left tube is draped. Both anterior and posterior cul-de-sac are normal. Liver is visualized and normal. Appendix is not visualized.   We began by infiltrating the serosa of the left paratubal cyst  with Vasopressine 20 units/100 cc of saline.  Using monopolar scissors, we open the cyst serosa and proceed with systematic dissection of the cyst wall  from the serosa. We use blunt and sharp dissection as well as traction/contra-traction. We free the cyst  with cyst rupture.Clear fluid is drained.  The cyst wall is removed from the abdomen through the umbilical incision after changing to a 5 mm camera.   We irrigate profusely to cleanse the pelvis and confirm a satisfactory hemostasis.  All instruments are then removed.  All trochars are removed under direct visualization after evacuating the pneumoperitoneum.   The fascia of the supraumbilical incision is closed with the previously placed pursestring suture of 0 Vicryl. All incisions are then closed with subcuticular suture of 3-0 Monocryl and Dermabond.   A speculum is inserted in the vagina to confirm adequate hemostasis on the cervix.   Instrument and sponge count is complete x2. Estimated blood loss is 10 cc.   The procedure is well tolerated by the patient  who  is taken to recovery room in a well and stable condition.   Specimen: Paratubal  cyst wall and endometrial curettings sent to pathology

## 2016-10-20 NOTE — Discharge Instructions (Signed)
DISCHARGE INSTRUCTIONS: Laparoscopy  The following instructions have been prepared to help you care for yourself upon your return home today.  Wound care:  Do not get the incision wet for the first 24 hours. The incision should be kept clean and dry.  The Band-Aids or dressings may be removed the day after surgery.  Should the incision become sore, red, and swollen after the first week, check with your doctor.  Personal hygiene:  Shower the day after your procedure.  Activity and limitations:  Do NOT drive or operate any equipment today.  Do NOT lift anything more than 15 pounds for 2-3 weeks after surgery.  Do NOT rest in bed all day.  Walking is encouraged. Walk each day, starting slowly with 5-minute walks 3 or 4 times a day. Slowly increase the length of your walks.  Walk up and down stairs slowly.  Do NOT do strenuous activities, such as golfing, playing tennis, bowling, running, biking, weight lifting, gardening, mowing, or vacuuming for 2-4 weeks. Ask your doctor when it is okay to start.  Diet: Eat a light meal as desired this evening. You may resume your usual diet tomorrow.  Return to work: This is dependent on the type of work you do. For the most part you can return to a desk job within a week of surgery. If you are more active at work, please discuss this with your doctor.  What to expect after your surgery: You may have a slight burning sensation when you urinate on the first day. You may have a very small amount of blood in the urine. Expect to have a small amount of vaginal discharge/light bleeding for 1-2 weeks. It is not unusual to have abdominal soreness and bruising for up to 2 weeks. You may be tired and need more rest for about 1 week. You may experience shoulder pain for 24-72 hours. Lying flat in bed may relieve it.  Call your doctor for any of the following:  Develop a fever of 100.4 or greater  Inability to urinate 6 hours after discharge from  hospital  Severe pain not relieved by pain medications  Persistent of heavy bleeding at incision site  Redness or swelling around incision site after a week  Increasing nausea or vomiting  Patient Signature________________________________________ Nurse Signature_________________________________________POST-OPERATIVE INSTRUCTIONS TO PATIENT  Call [facility]  [phone])  for excessive pain, bleeding or temperature greater than or equal to 100.4 degrees (orally).    No driving for 24 hours No lifting (more than 20 lbs) for 2 weeks No sexual activity for 2 weeks  Pain management:  Use Ibuprofen 600 mg every 6 hours for 5 days and then as needed. Use your pain medication as needed to maintain a pain level at or below 3/10 Use Colace 1-2 capsules per day as long as you are using pain medication to avoid constipation.       Diet: normal  Bathing: may shower day after surgery  Wound Care: keep incisions clean and dry  Return to Dr. Cletis Media as scheduled  Return to work: 1 week    [name],Natasha A MD 10/20/2016 1:44 PM

## 2016-10-20 NOTE — Transfer of Care (Signed)
Immediate Anesthesia Transfer of Care Note  Patient: Natasha [name]  Procedure(s) Performed: Procedure(s): LAPAROSCOPY OPERATIVE, left paratubal cystectomy (Left)  Patient Location: PACU  Anesthesia Type:General  Level of Consciousness: awake, alert , oriented and patient cooperative  Airway & Oxygen Therapy: Patient Spontanous Breathing and Patient connected to nasal cannula oxygen  Post-op Assessment: Report given to RN and Post -op Vital signs reviewed and stable  Post vital signs: Reviewed and stable  Last Vitals:  Vitals:   10/20/16 1228  BP: (!) 142/103  Pulse: (!) 59  Resp: 16  Temp: 36.4 C    Last Pain:  Vitals:   10/20/16 1228  TempSrc: Oral  PainSc: 0-No pain      Patients Stated Pain Goal: 5 (14/70/92 9574)  Complications: No apparent anesthesia complications

## 2016-10-21 ENCOUNTER — Encounter (HOSPITAL_COMMUNITY): Payer: Self-pay | Admitting: Obstetrics and Gynecology

## 2016-10-21 NOTE — Anesthesia Postprocedure Evaluation (Signed)
Anesthesia Post Note  Patient: Natasha [name]  Procedure(s) Performed: Procedure(s) (LRB): LAPAROSCOPY OPERATIVE, left paratubal cystectomy (Left)  Patient location during evaluation: PACU Anesthesia Type: General Level of consciousness: awake and alert Pain management: pain level controlled Vital Signs Assessment: post-procedure vital signs reviewed and stable Respiratory status: spontaneous breathing, nonlabored ventilation, respiratory function stable and patient connected to nasal cannula oxygen Cardiovascular status: blood pressure returned to baseline and stable Postop Assessment: no signs of nausea or vomiting Anesthetic complications: no       Last Vitals:  Vitals:   10/20/16 1745 10/20/16 1830  BP: 130/69 125/70  Pulse: (!) 58   Resp: 18 16  Temp: 36.6 C 37.1 C    Last Pain:  Vitals:   10/20/16 1745  TempSrc:   PainSc: 3                  Lynda Rainwater

## 2017-05-12 ENCOUNTER — Encounter: Payer: Self-pay | Admitting: Registered"

## 2017-05-12 ENCOUNTER — Encounter: Payer: BLUE CROSS/BLUE SHIELD | Attending: Obstetrics and Gynecology | Admitting: Registered"

## 2017-05-12 DIAGNOSIS — Z713 Dietary counseling and surveillance: Secondary | ICD-10-CM | POA: Insufficient documentation

## 2017-05-12 NOTE — Patient Instructions (Addendum)
Aim to have breakfast most days Think of ways to get a little more time in for more movement. Aim to eat balanced meals, keep it simple Consider when having tea, ask for 1/2 sweet/unsweet to cut back on sugar. Can dilute juice. Aim to eat mindfully, not distracted - Mindless Eating eating might be an interesting book for you. [address] might be a place for fresh seafood Think about asking your doctor to look into thryoid function tests

## 2017-05-12 NOTE — Progress Notes (Signed)
Medical Nutrition Therapy:  Appt start time: 0900 end time:  1000.  Assessment:  Primary concerns today: Pt states she feels her dietary choices are fine and wants to make sure she is doing what she needs to do to ensure she stays healthy. Pt states she has been overweight all her life and hasn't been too concerned about it but doesn't want to gain too much weight that would affect her health. Pt states she usually only eats breakfast on weekends and when she is traveling.  Pt states she has always had irregular periods and has been on OCP since 36 yrs old to regulate her period. Pt states she would like to stop taking birth control, but when she did she had extremely heavy periods.  Pt states she 2 jobs and works 50-60 hrs/week. Pt states she travels for work and will eat breakfast when traveling. Pt states she sleeps well.  Preferred Learning Style:   No preference indicated   Learning Readiness:   Ready  MEDICATIONS: reviewed   DIETARY INTAKE:  Usual eating pattern includes 2 meals and 1-2 snacks per day.  24-hr recall:  B ( AM): none OR 1-2x week cooks at home, on road eggs & potatoes Snk ( AM): none  L ( PM): sub at [employer], apple juice Snk ( PM): granola bar D ( PM): [employer]aki chicken, rice and taco shell Snk ( PM): none Beverages: water, occasionally sweet tea at restaurants or a ginger ale  Usual physical activity: 15 min breaks at work goal of 4,700 steps has just increased goal to 5500 steps. Walks dog after work  Estimated energy needs: 1800 calories 200 g carbohydrates 113 g protein 60 g fat   Nutritional Diagnosis:  NB-1.1 Food and nutrition-related knowledge deficit As related to importance and purpose of breakfast and balanced meals.  As evidenced by dietary recall and stated belief her recent breakfast choice was not good.    Intervention:  Nutrition Education. Discussed role of macronutrient's and balanced eating. Discussed sources of concentrated  sweets. Discussed mindful eating.  Plan: Aim to have breakfast most days Think of ways to get a little more time in for more movement. Aim to eat balanced meals, keep it simple Consider when having tea, ask for 1/2 sweet/unsweet to cut back on sugar. Can dilute juice. Aim to eat mindfully, not distracted - Mindless Eating eating might be an interesting book for you. [address] might be a place for fresh seafood Think about asking your doctor to look into thryoid function tests  Teaching Method Utilized:  Visual Auditory  Handouts given during visit include:  No Sodium Seasoning  My Plate  Barriers to learning/adherence to lifestyle change: none  Demonstrated degree of understanding via:  Teach Back   Monitoring/Evaluation:  Dietary intake, exercise, and body weight prn.
